# Patient Record
Sex: Female | Born: 1995 | Race: White | Hispanic: No | Marital: Single | Smoking: Current every day smoker
Health system: Southern US, Community
[De-identification: ages and names within clinical notes are randomized; demographics above are authoritative.]

---
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
## 2016-04-29 ENCOUNTER — Emergency Department: Payer: BC Managed Care – PPO

## 2016-04-29 ENCOUNTER — Encounter: Payer: Self-pay | Admitting: *Deleted

## 2016-04-29 ENCOUNTER — Emergency Department
Admission: EM | Admit: 2016-04-29 | Discharge: 2016-04-29 | Disposition: A | Discharge status: Home / Self Care | Payer: BC Managed Care – PPO | Attending: Emergency Medicine | Admitting: Emergency Medicine

## 2016-04-29 DIAGNOSIS — Y92410 Unspecified street and highway as the place of occurrence of the external cause: Secondary | ICD-10-CM | POA: Diagnosis not present

## 2016-04-29 DIAGNOSIS — Y9389 Activity, other specified: Secondary | ICD-10-CM | POA: Diagnosis not present

## 2016-04-29 DIAGNOSIS — S20211A Contusion of right front wall of thorax, initial encounter: Secondary | ICD-10-CM | POA: Insufficient documentation

## 2016-04-29 DIAGNOSIS — Y999 Unspecified external cause status: Secondary | ICD-10-CM | POA: Insufficient documentation

## 2016-04-29 DIAGNOSIS — R0789 Other chest pain: Secondary | ICD-10-CM | POA: Insufficient documentation

## 2016-04-29 DIAGNOSIS — S29001A Unspecified injury of muscle and tendon of front wall of thorax, initial encounter: Secondary | ICD-10-CM | POA: Diagnosis present

## 2016-04-29 LAB — BASIC METABOLIC PANEL
ANION GAP: 8 (ref 5–15)
BUN: 12 mg/dL (ref 6–20)
CHLORIDE: 109 mmol/L (ref 101–111)
CO2: 21 mmol/L — AB (ref 22–32)
Calcium: 9.4 mg/dL (ref 8.9–10.3)
Creatinine, Ser: 0.87 mg/dL (ref 0.44–1.00)
GFR calc non Af Amer: >60 mL/min (ref >60)
Glucose, Bld: 97 mg/dL (ref 65–99)
POTASSIUM: 4.9 mmol/L (ref 3.5–5.1)
SODIUM: 138 mmol/L (ref 135–145)

## 2016-04-29 LAB — CBC
HEMATOCRIT: 43.2 % (ref 35.0–47.0)
HEMOGLOBIN: 14.6 g/dL (ref 12.0–16.0)
MCH: 30.8 pg (ref 26.0–34.0)
MCHC: 33.7 g/dL (ref 32.0–36.0)
MCV: 91.3 fL (ref 80.0–100.0)
PLATELETS: 218 10*3/uL (ref 150–440)
RBC: 4.74 MIL/uL (ref 3.80–5.20)
RDW: 13.5 % (ref 11.5–14.5)
WBC: 8 10*3/uL (ref 3.6–11.0)

## 2016-04-29 LAB — TROPONIN I: Troponin I: <0.03 ng/mL (ref <0.031)

## 2016-04-29 MED ORDER — IBUPROFEN 800 MG PO TABS: 800.0000 mg | ORAL_TABLET | Freq: Three times a day (TID) | ORAL | Status: AC | PRN

## 2016-04-29 MED ORDER — IOPAMIDOL (ISOVUE-300) INJECTION 61%
75.0000 mL | Freq: Once | INTRAVENOUS | Status: AC | PRN
  Administered 2016-04-29: 75 mL via INTRAVENOUS
  Filled 2016-04-29: qty 75

## 2016-04-29 MED ORDER — CYCLOBENZAPRINE HCL 5 MG PO TABS: 5.0000 mg | ORAL_TABLET | Freq: Three times a day (TID) | ORAL | Status: DC | PRN
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
## 2017-07-14 ENCOUNTER — Emergency Department: Payer: Medicaid Other

## 2017-07-14 ENCOUNTER — Emergency Department
Admission: EM | Admit: 2017-07-14 | Discharge: 2017-07-14 | Disposition: A | Discharge status: Home / Self Care | Payer: Medicaid Other | Attending: Emergency Medicine | Admitting: Emergency Medicine

## 2017-07-14 DIAGNOSIS — Y939 Activity, unspecified: Secondary | ICD-10-CM | POA: Diagnosis not present

## 2017-07-14 DIAGNOSIS — Y92831 Amusement park as the place of occurrence of the external cause: Secondary | ICD-10-CM | POA: Diagnosis not present

## 2017-07-14 DIAGNOSIS — Y999 Unspecified external cause status: Secondary | ICD-10-CM | POA: Insufficient documentation

## 2017-07-14 DIAGNOSIS — W16312A Fall into other water striking water surface causing other injury, initial encounter: Secondary | ICD-10-CM | POA: Diagnosis not present

## 2017-07-14 DIAGNOSIS — S83422A Sprain of lateral collateral ligament of left knee, initial encounter: Secondary | ICD-10-CM | POA: Insufficient documentation

## 2017-07-14 DIAGNOSIS — S8992XA Unspecified injury of left lower leg, initial encounter: Secondary | ICD-10-CM | POA: Diagnosis present

## 2017-07-14 MED ORDER — NAPROXEN 500 MG PO TABS: 500.0000 mg | ORAL_TABLET | Freq: Two times a day (BID) | ORAL | Status: DC

## 2017-07-14 NOTE — Discharge Instructions (Signed)
Knee support for 3-5 days as needed.
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
## 2017-07-14 NOTE — ED Triage Notes (Signed)
[facility]ulatory to triage.
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
## 2018-07-09 LAB — HM HIV SCREENING LAB: HM HIV Screening: NEGATIVE

## 2018-11-28 ENCOUNTER — Emergency Department
Admission: EM | Admit: 2018-11-28 | Discharge: 2018-11-28 | Disposition: A | Discharge status: Home / Self Care | Payer: No Typology Code available for payment source | Attending: Student in an Organized Health Care Education/Training Program | Admitting: Student in an Organized Health Care Education/Training Program

## 2018-11-28 ENCOUNTER — Other Ambulatory Visit: Payer: Self-pay

## 2018-11-28 ENCOUNTER — Emergency Department: Payer: No Typology Code available for payment source

## 2018-11-28 ENCOUNTER — Encounter: Payer: Self-pay | Admitting: Emergency Medicine

## 2018-11-28 DIAGNOSIS — Y92410 Unspecified street and highway as the place of occurrence of the external cause: Secondary | ICD-10-CM | POA: Diagnosis not present

## 2018-11-28 DIAGNOSIS — Y998 Other external cause status: Secondary | ICD-10-CM | POA: Insufficient documentation

## 2018-11-28 DIAGNOSIS — M79631 Pain in right forearm: Secondary | ICD-10-CM | POA: Insufficient documentation

## 2018-11-28 DIAGNOSIS — S161XXA Strain of muscle, fascia and tendon at neck level, initial encounter: Secondary | ICD-10-CM | POA: Insufficient documentation

## 2018-11-28 DIAGNOSIS — F17200 Nicotine dependence, unspecified, uncomplicated: Secondary | ICD-10-CM | POA: Insufficient documentation

## 2018-11-28 DIAGNOSIS — S199XXA Unspecified injury of neck, initial encounter: Secondary | ICD-10-CM | POA: Diagnosis present

## 2018-11-28 DIAGNOSIS — Y9389 Activity, other specified: Secondary | ICD-10-CM | POA: Diagnosis not present

## 2018-11-28 DIAGNOSIS — W2210XA Striking against or struck by unspecified automobile airbag, initial encounter: Secondary | ICD-10-CM | POA: Diagnosis not present

## 2018-11-28 MED ORDER — NAPROXEN 500 MG PO TABS: 500.0000 mg | ORAL_TABLET | Freq: Two times a day (BID) | ORAL | Status: DC

## 2018-11-28 MED ORDER — CYCLOBENZAPRINE HCL 5 MG PO TABS: 5.0000 mg | ORAL_TABLET | Freq: Three times a day (TID) | ORAL | 0 refills | Status: DC | PRN
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
## 2018-11-28 NOTE — Discharge Instructions (Signed)
Follow up with the primary care provider of your choice for symptoms that are not improving over the week °Return to the ER for symptoms that change or worsen if unable to schedule an appointment. °
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
## 2019-07-29 ENCOUNTER — Ambulatory Visit (LOCAL_COMMUNITY_HEALTH_CENTER): Payer: Medicaid Other

## 2019-07-29 ENCOUNTER — Other Ambulatory Visit: Payer: Self-pay

## 2019-07-29 VITALS — BP 112/73 | Ht 64.0 in | Wt 118.0 lb

## 2019-07-29 DIAGNOSIS — Z30013 Encounter for initial prescription of injectable contraceptive: Secondary | ICD-10-CM

## 2019-07-29 DIAGNOSIS — Z3009 Encounter for other general counseling and advice on contraception: Secondary | ICD-10-CM

## 2019-07-29 DIAGNOSIS — Z3042 Encounter for surveillance of injectable contraceptive: Secondary | ICD-10-CM

## 2019-07-29 MED ORDER — MEDROXYPROGESTERONE ACETATE 150 MG/ML IM SUSP
150.0000 mg | Freq: Once | INTRAMUSCULAR | Status: AC
  Administered 2019-07-29: 150 mg via INTRAMUSCULAR

## 2019-07-29 NOTE — Progress Notes (Signed)
Last physical at [facility] was 07/09/2018. Last depo at [facility] was 05/11/2019; 11.2 weeks post depo. DMPA 150 mg IM per Dr. Lauretta Chester order for one time depo when physical is due.

## 2019-10-29 ENCOUNTER — Other Ambulatory Visit: Payer: Self-pay

## 2019-10-29 ENCOUNTER — Ambulatory Visit (LOCAL_COMMUNITY_HEALTH_CENTER): Payer: Medicaid Other

## 2019-10-29 VITALS — BP 112/73 | Ht 64.0 in | Wt 131.0 lb

## 2019-10-29 DIAGNOSIS — Z3009 Encounter for other general counseling and advice on contraception: Secondary | ICD-10-CM | POA: Diagnosis not present

## 2019-10-29 DIAGNOSIS — Z30013 Encounter for initial prescription of injectable contraceptive: Secondary | ICD-10-CM | POA: Diagnosis not present

## 2019-10-29 MED ORDER — MEDROXYPROGESTERONE ACETATE 150 MG/ML IM SUSP
150.0000 mg | Freq: Once | INTRAMUSCULAR | Status: AC
Start: 2019-10-29 — End: 2019-10-29
  Administered 2019-10-29: 150 mg via INTRAMUSCULAR

## 2019-10-29 NOTE — Progress Notes (Signed)
Last physical at [facility] was 07/09/2018. Per [employer] Records PAP due 06/2019, CBE due 2022. Last depo at [facility] 07/29/2019; 13.1 weeks. Consulted with provider regarding pt request to continue depo today; DMPA 150 mg IM administered today per Donnal Moat, CNM verbal order, and pt to see provider at next visit when depo is due to have PAP performed.

## 2020-01-27 ENCOUNTER — Other Ambulatory Visit: Payer: Self-pay

## 2020-01-27 ENCOUNTER — Encounter: Payer: Self-pay | Admitting: Physician Assistant

## 2020-01-27 ENCOUNTER — Ambulatory Visit (LOCAL_COMMUNITY_HEALTH_CENTER): Payer: Medicaid Other | Admitting: Physician Assistant

## 2020-01-27 VITALS — BP 130/82 | Ht 64.0 in | Wt 130.0 lb

## 2020-01-27 DIAGNOSIS — Z3009 Encounter for other general counseling and advice on contraception: Secondary | ICD-10-CM

## 2020-01-27 MED ORDER — THERA VITAL M PO TABS: 1.0000 | ORAL_TABLET | Freq: Every day | ORAL | 3 refills | Status: AC

## 2020-01-27 MED ORDER — MEDROXYPROGESTERONE ACETATE 150 MG/ML IM SUSP
150.0000 mg | Freq: Once | INTRAMUSCULAR | Status: AC
  Administered 2020-01-27: 17:00:00 150 mg via INTRAMUSCULAR

## 2020-01-27 NOTE — Progress Notes (Signed)
In for Depo; declines HIV/RPR testing Sharlette Dense, RN MVI given, declined condoms Sharlette Dense, RN

## 2020-01-27 NOTE — Progress Notes (Signed)
Family Planning Visit- Repeat Yearly Visit  Subjective:  Lynn Daniels is a 24 y.o. being seen today for an well woman visit and to discuss family planning options.    She is currently using Depo-Provera injections for pregnancy prevention. Patient reports she does not want a pregnancy in the next year. Patient  does not have a problem list on file. Last sex 06/2019. Last DMPA [redacted]w[redacted]d ago on 10/29/19.  Chief Complaint  Patient presents with  . Contraception    Patient reports she feels well.  Patient denies problems with DMPA, new medical problems since last visit, or new family medical problems.    Does the patient desire a pregnancy in the next year? (OKQ flowsheet)  See flowsheet for other program required questions.   Body mass index is 22.31 kg/m. - Patient is eligible for diabetes screening based on BMI and age >47?  no HA1C ordered? No    Patient reports 1 of partners in last year. Desires STI screening?  No - pt declines, low risk  Does the patient have a current or past history of drug use? No   No components found for: HCV]   Health Maintenance Due  Topic Date Due  . TETANUS/TDAP  07/25/2016  . PAP-Cervical Cytology Screening  07/25/2018  . PAP SMEAR-Modifier  07/25/2018  . INFLUENZA VACCINE  07/31/2019    Review of Systems  All other systems reviewed and are negative.   The following portions of the patient's history were reviewed and updated as appropriate: allergies, current medications, past family history, past medical history, past social history, past surgical history and problem list. Problem list updated.  Objective:   Vitals:   01/27/20 1529  BP: 130/82  Weight: 130 lb (59 kg)  Height: 5\' 4"  (1.626 m)    Physical Exam Constitutional:      Appearance: Normal appearance. She is normal weight.  Pulmonary:     Effort: Pulmonary effort is normal.  Abdominal:     General: Abdomen is flat.     Palpations: Abdomen is soft.     Tenderness: There is  no abdominal tenderness.     Hernia: There is no hernia in the left inguinal area or right inguinal area.  Genitourinary:    Pubic Area: No rash.      Labia:        Right: No rash, tenderness or lesion.        Left: No rash, tenderness or lesion.      Urethra: No urethral pain or urethral swelling.     Vagina: No vaginal discharge, erythema, tenderness, bleeding or lesions.     Cervix: No cervical motion tenderness, discharge, friability, lesion or erythema.     Uterus: Not enlarged and not tender.      Adnexa:        Right: No mass or tenderness.         Left: No mass or tenderness.       Rectum: No external hemorrhoid.  Lymphadenopathy:     Lower Body: No right inguinal adenopathy. No left inguinal adenopathy.  Skin:    General: Skin is warm and dry.     Findings: No rash.  Neurological:     Mental Status: She is alert and oriented to person, place, and time.  Psychiatric:        Mood and Affect: Mood normal.       Assessment and Plan:  Lynn Daniels is a 24 y.o. female presenting to the [facility] for an initial well woman exam/family planning visit  Contraception counseling: Reviewed all forms of birth control options in the tiered based approach. available including abstinence; over the counter/barrier methods; hormonal contraceptive medication including pill, patch, ring, injection,contraceptive implant; hormonal and nonhormonal IUDs; permanent sterilization options including vasectomy and the various tubal sterilization modalities. Risks, benefits, and typical effectiveness rates were reviewed.  Questions were answered.  Written information was also given to the patient to review.  Patient desires DMPA, this was prescribed for patient. She will follow up in  11-13 weeks for surveillance.  She was told to call with any further questions, or with any concerns about this method of contraception.  Emphasized use of condoms 100% of the time for STI  prevention.  Patient was not offered ECP.  1. Family planning services Please give DMPA 150mg  IM today repeat q 11-13 weeks for 1 year. First Pap today. - Multiple Vitamins-Minerals (MULTIVITAMIN) tablet; Take 1 tablet by mouth daily.  Dispense: 30 tablet; Refill: 3 - Pap IG (Image Guided) - medroxyPROGESTERone (DEPO-PROVERA) injection 150 mg   Return in about 12 weeks (around 04/20/2020) for Routine DMPA injection.  No future appointments.  Lora Havens, PA-C

## 2020-01-31 LAB — PAP IG (IMAGE GUIDED): PAP Smear Comment: 0

## 2020-04-08 ENCOUNTER — Ambulatory Visit: Payer: BC Managed Care – PPO

## 2020-04-15 ENCOUNTER — Ambulatory Visit: Payer: BC Managed Care – PPO

## 2020-04-18 ENCOUNTER — Other Ambulatory Visit: Payer: Self-pay

## 2020-04-18 ENCOUNTER — Ambulatory Visit (LOCAL_COMMUNITY_HEALTH_CENTER): Payer: Medicaid Other

## 2020-04-18 VITALS — BP 121/85 | Ht 63.0 in | Wt 122.0 lb

## 2020-04-18 DIAGNOSIS — Z30013 Encounter for initial prescription of injectable contraceptive: Secondary | ICD-10-CM

## 2020-04-18 DIAGNOSIS — Z3042 Encounter for surveillance of injectable contraceptive: Secondary | ICD-10-CM

## 2020-04-18 DIAGNOSIS — Z3009 Encounter for other general counseling and advice on contraception: Secondary | ICD-10-CM | POA: Diagnosis not present

## 2020-04-18 MED ORDER — MEDROXYPROGESTERONE ACETATE 150 MG/ML IM SUSP
150.0000 mg | Freq: Once | INTRAMUSCULAR | Status: AC
  Administered 2020-04-18: 150 mg via INTRAMUSCULAR

## 2020-04-18 NOTE — Progress Notes (Signed)
Here today for Depo. Flowsheet completed. Depo given per 01/27/2020 order. Patient tolerated well. Tawny Hopping, RN

## 2020-04-19 NOTE — Addendum Note (Signed)
Addended by: Tawny Hopping A on: 04/19/2020 12:05 PM   Modules accepted: Level of Service

## 2020-06-16 ENCOUNTER — Encounter: Payer: Self-pay | Admitting: Family Medicine

## 2020-06-16 DIAGNOSIS — R87620 Atypical squamous cells of undetermined significance on cytologic smear of vagina (ASC-US): Secondary | ICD-10-CM | POA: Insufficient documentation

## 2020-07-06 ENCOUNTER — Ambulatory Visit: Payer: Medicaid Other

## 2020-07-12 ENCOUNTER — Other Ambulatory Visit: Payer: Self-pay

## 2020-07-12 ENCOUNTER — Ambulatory Visit (LOCAL_COMMUNITY_HEALTH_CENTER): Payer: Medicaid Other

## 2020-07-12 VITALS — BP 122/83 | Ht 64.0 in | Wt 117.0 lb

## 2020-07-12 DIAGNOSIS — Z30013 Encounter for initial prescription of injectable contraceptive: Secondary | ICD-10-CM

## 2020-07-12 DIAGNOSIS — Z3009 Encounter for other general counseling and advice on contraception: Secondary | ICD-10-CM | POA: Diagnosis not present

## 2020-07-12 MED ORDER — MEDROXYPROGESTERONE ACETATE 150 MG/ML IM SUSP
150.0000 mg | Freq: Once | INTRAMUSCULAR | Status: AC
  Administered 2020-07-12: 150 mg via INTRAMUSCULAR

## 2020-07-12 MED ORDER — MULTI-VITAMIN/MINERALS PO TABS: 1.0000 | ORAL_TABLET | Freq: Every day | ORAL | 0 refills | Status: AC

## 2020-07-12 NOTE — Progress Notes (Signed)
Depo administered without difficulty per 1/282021 written order of A. Streilein PA-C and client tolerated without complaint. Jossie Ng, RN
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
## 2021-01-09 ENCOUNTER — Telehealth: Payer: Self-pay

## 2021-01-09 NOTE — Telephone Encounter (Signed)
Telephone call to patient regarding the need for a repeat PAP and physical due 12/2020. Left message to return my call. Remmie Bembenek, RN  

## 2021-02-07 NOTE — Telephone Encounter (Signed)
Telephone call to patient regarding the need for a repeat PAP and Physical.  Left a message to please return my call at [phone].  PAP letter mailed today. Hart Carwin, RN

## 2021-03-02 ENCOUNTER — Telehealth: Payer: Self-pay

## 2021-03-02 NOTE — Progress Notes (Unsigned)
Patient due for repeat PAP and Physical 12/2020.  No response to messages left on 01/09/2021 and 02/07/2021 nor PAP letter mailed on 02/07/2021.  Telephone call today with no answer.  Close to PAP follow-up until patient initiates contact with [facility]. Hart Carwin, RN

## 2021-03-02 NOTE — Telephone Encounter (Signed)
Telephone call to patient today regarding the need for a repeat PAP and Physical due 12/2020.  No response to messages left previously in January and February nor PAP letter mailed 02/07/2021. Hart Carwin, RN

## 2023-02-28 DIAGNOSIS — Z419 Encounter for procedure for purposes other than remedying health state, unspecified: Secondary | ICD-10-CM | POA: Diagnosis not present

## 2023-03-31 DIAGNOSIS — Z419 Encounter for procedure for purposes other than remedying health state, unspecified: Secondary | ICD-10-CM | POA: Diagnosis not present

## 2023-04-30 DIAGNOSIS — Z419 Encounter for procedure for purposes other than remedying health state, unspecified: Secondary | ICD-10-CM | POA: Diagnosis not present

## 2023-05-31 DIAGNOSIS — Z419 Encounter for procedure for purposes other than remedying health state, unspecified: Secondary | ICD-10-CM | POA: Diagnosis not present

## 2023-06-30 DIAGNOSIS — Z419 Encounter for procedure for purposes other than remedying health state, unspecified: Secondary | ICD-10-CM | POA: Diagnosis not present

## 2023-07-31 DIAGNOSIS — Z419 Encounter for procedure for purposes other than remedying health state, unspecified: Secondary | ICD-10-CM | POA: Diagnosis not present

## 2023-08-31 DIAGNOSIS — Z419 Encounter for procedure for purposes other than remedying health state, unspecified: Secondary | ICD-10-CM | POA: Diagnosis not present

## 2023-09-30 DIAGNOSIS — Z419 Encounter for procedure for purposes other than remedying health state, unspecified: Secondary | ICD-10-CM | POA: Diagnosis not present

## 2023-10-31 DIAGNOSIS — Z419 Encounter for procedure for purposes other than remedying health state, unspecified: Secondary | ICD-10-CM | POA: Diagnosis not present

## 2023-11-30 DIAGNOSIS — Z419 Encounter for procedure for purposes other than remedying health state, unspecified: Secondary | ICD-10-CM | POA: Diagnosis not present

## 2023-12-31 DIAGNOSIS — Z419 Encounter for procedure for purposes other than remedying health state, unspecified: Secondary | ICD-10-CM | POA: Diagnosis not present

## 2024-01-31 DIAGNOSIS — Z419 Encounter for procedure for purposes other than remedying health state, unspecified: Secondary | ICD-10-CM | POA: Diagnosis not present

## 2024-02-28 DIAGNOSIS — Z419 Encounter for procedure for purposes other than remedying health state, unspecified: Secondary | ICD-10-CM | POA: Diagnosis not present

## 2024-03-29 DIAGNOSIS — F3181 Bipolar II disorder: Secondary | ICD-10-CM | POA: Diagnosis not present

## 2024-04-10 DIAGNOSIS — Z419 Encounter for procedure for purposes other than remedying health state, unspecified: Secondary | ICD-10-CM | POA: Diagnosis not present

## 2024-04-15 ENCOUNTER — Encounter: Payer: Self-pay | Admitting: Student

## 2024-04-15 ENCOUNTER — Telehealth: Payer: Self-pay | Admitting: Student

## 2024-04-15 ENCOUNTER — Ambulatory Visit (INDEPENDENT_AMBULATORY_CARE_PROVIDER_SITE_OTHER): Admitting: Student

## 2024-04-15 VITALS — BP 100/64 | HR 97 | Ht 64.0 in | Wt 107.9 lb

## 2024-04-15 DIAGNOSIS — Z720 Tobacco use: Secondary | ICD-10-CM | POA: Diagnosis not present

## 2024-04-15 DIAGNOSIS — Z87898 Personal history of other specified conditions: Secondary | ICD-10-CM

## 2024-04-15 DIAGNOSIS — E039 Hypothyroidism, unspecified: Secondary | ICD-10-CM | POA: Insufficient documentation

## 2024-04-15 DIAGNOSIS — B370 Candidal stomatitis: Secondary | ICD-10-CM | POA: Insufficient documentation

## 2024-04-15 DIAGNOSIS — N63 Unspecified lump in unspecified breast: Secondary | ICD-10-CM | POA: Diagnosis not present

## 2024-04-15 DIAGNOSIS — N200 Calculus of kidney: Secondary | ICD-10-CM | POA: Insufficient documentation

## 2024-04-15 DIAGNOSIS — R87620 Atypical squamous cells of undetermined significance on cytologic smear of vagina (ASC-US): Secondary | ICD-10-CM | POA: Diagnosis not present

## 2024-04-15 DIAGNOSIS — J029 Acute pharyngitis, unspecified: Secondary | ICD-10-CM | POA: Diagnosis not present

## 2024-04-15 NOTE — Assessment & Plan Note (Signed)
>>  ASSESSMENT AND PLAN FOR SORE THROAT WRITTEN ON 04/15/2024 10:18 AM BY Cari Char, Verne Lanuza, MD  Sore throat and muffled voice. Her dentist referred her to ENT she has number to schedule and plan to set up appointment. Vitals are stable and she appears non toxic. Exudates and cervical lymphadenopathy on exam. Offered evaluation including throat swab, culture, and antibiotics. Discussed it may take time to have this scheduled and we could treat for infection in the meantime. Patient understands but declined evaluation stating she would have this evaluated by ENT. Return precautions discussed.

## 2024-04-15 NOTE — Progress Notes (Signed)
 New Patient Office Visit  Subjective    Patient ID: Lynn Daniels, female    DOB: 15-Apr-1996  Age: 28 y.o. MRN: [id number]  CC:  Chief Complaint  Patient presents with   Breast Problem    Breast lump in both breast and painful and tender to touch. She noticed one lump in her left breast 2 months ago and noticed the lump in her right breast 2 weeks ago.     HPI Lynn Daniels is a 28 year old person presents to establish care.  Breast mass She presents for evaluation of bilateral breast mass. First noticed left sided mass 2 months when moving her arm. 2  weeks ago also noticed right sided mass while showering. Mild irritation arms brush against her sides. Does not think masses has changes in size. She denies rash, nipple discharge,  fever chills or weight loss. Reports great aunt on mother's side had breast cancer.    Outpatient Encounter Medications as of 04/15/2024  Medication Sig   acetaminophen (TYLENOL) 325 MG tablet Take 650 mg by mouth every 6 (six) hours as needed. PRN (Patient not taking: Reported on 04/15/2024)   ibuprofen (ADVIL,MOTRIN) 800 MG tablet Take 1 tablet (800 mg total) by mouth every 8 (eight) hours as needed. (Patient not taking: Reported on 04/15/2024)   Multiple Vitamins-Minerals (MULTIVITAMIN WITH MINERALS) tablet Take 1 tablet by mouth daily. (Patient not taking: Reported on 04/15/2024)   [DISCONTINUED] cyclobenzaprine (FLEXERIL) 5 MG tablet Take 1 tablet (5 mg total) by mouth every 8 (eight) hours as needed for muscle spasms. (Patient not taking: Reported on 07/29/2019)   [DISCONTINUED] naproxen (NAPROSYN) 500 MG tablet Take 1 tablet (500 mg total) by mouth 2 (two) times daily with a meal. (Patient not taking: Reported on 07/29/2019)   No facility-administered encounter medications on file as of 04/15/2024.    History reviewed. No pertinent past medical history.  History reviewed. No pertinent surgical history.  Family History  Problem Relation Age of Onset    Hypertension Father     Social History   Socioeconomic History   Marital status: Single    Spouse name: Not on file   Number of children: Not on file   Years of education: Not on file   Highest education level: Not on file  Occupational History   Not on file  Tobacco Use   Smoking status: Every Day    Average packs/day: 0.3 packs/day for 4.0 years (1.0 ttl pk-yrs)    Types: Cigarettes    Start date: 2021   Smokeless tobacco: Never  Substance and Sexual Activity   Alcohol use: Not Currently   Drug use: Not Currently    Types: Cocaine, Oxycodone    Comment: last use about a year   Sexual activity: Yes    Partners: Male    Comment: single long term partner  Other Topics Concern   Not on file  Social History Narrative   Works at [employer] investment banker Strain: Not on file  Food Insecurity: Not on file  Transportation Needs: Not on file  Physical Activity: Not on file  Stress: Not on file  Social Connections: Not on file  Intimate Partner Violence: Not on file    ROS Refer to HPI    Objective   BP 100/64   Pulse 97   Ht 5\' 4"  (1.626 m)   Wt 107 lb 14.4 oz (48.9 kg)   SpO2 98%   BMI  18.52 kg/m   Physical Exam Constitutional:      Appearance: Normal appearance.  HENT:     Mouth/Throat:     Mouth: Mucous membranes are moist.     Pharynx: Oropharyngeal exudate and posterior oropharyngeal erythema present. No uvula swelling.     Tonsils: Tonsillar exudate present. No tonsillar abscesses.      Comments: Dental cares without gingival swelling or edema Neck:     Thyroid: No thyroid mass, thyromegaly or thyroid tenderness.  Cardiovascular:     Rate and Rhythm: Normal rate and regular rhythm.  Pulmonary:     Effort: Pulmonary effort is normal.     Breath sounds: No rhonchi or rales.  Chest:  Breasts:    Right: Mass present. No swelling, inverted nipple, nipple discharge (2 cm mobile mass of the upper outer qudrant) or skin  change.     Left: Mass (1.5 cm mobile mass of the lower outer quadrant) present. No swelling, inverted nipple, nipple discharge or skin change.  Abdominal:     General: Abdomen is flat. Bowel sounds are normal. There is no distension.     Palpations: Abdomen is soft.     Tenderness: There is no abdominal tenderness.  Musculoskeletal:        General: Normal range of motion.     Right lower leg: No edema.     Left lower leg: No edema.  Lymphadenopathy:     Cervical: Cervical adenopathy present.     Right cervical: Posterior cervical adenopathy (0.5 cm node behind ear) present.     Left cervical: Posterior cervical adenopathy (multiple subcentimeter mobile) present.     Upper Body:     Right upper body: No supraclavicular, axillary or pectoral adenopathy.     Left upper body: No supraclavicular, axillary or pectoral adenopathy.  Skin:    General: Skin is warm and dry.     Capillary Refill: Capillary refill takes less than 2 seconds.  Neurological:     General: No focal deficit present.     Mental Status: She is alert and oriented to person, place, and time.  Psychiatric:        Mood and Affect: Mood normal.        Behavior: Behavior normal.        04/15/2024    9:15 AM  Depression screen PHQ 2/9  Decreased Interest 0  Down, Depressed, Hopeless 1  PHQ - 2 Score 1  Altered sleeping 1  Tired, decreased energy 0  Change in appetite 0  Feeling bad or failure about yourself  0  Trouble concentrating 2  Moving slowly or fidgety/restless 0  Suicidal thoughts 0  PHQ-9 Score 4  Difficult doing work/chores Not difficult at all      04/15/2024    9:15 AM  GAD 7 : Generalized Anxiety Score  Nervous, Anxious, on Edge 0  Control/stop worrying 0  Worry too much - different things 0  Trouble relaxing 0  Restless 0  Easily annoyed or irritable 0  Afraid - awful might happen 0  Total GAD 7 Score 0  Anxiety Difficulty Not difficult at all    Last CBC Lab Results  Component Value  Date   WBC 8.0 04/29/2016   HGB 14.6 04/29/2016   HCT 43.2 04/29/2016   MCV 91.3 04/29/2016   MCH 30.8 04/29/2016   RDW 13.5 04/29/2016   PLT 218 04/29/2016   Last metabolic panel Lab Results  Component Value Date   GLUCOSE 97 04/29/2016  NA 138 04/29/2016   K 4.9 04/29/2016   CL 109 04/29/2016   CO2 21 (L) 04/29/2016   BUN 12 04/29/2016   CREATININE 0.87 04/29/2016   GFRNONAA >60 04/29/2016   CALCIUM 9.4 04/29/2016   ANIONGAP 8 04/29/2016    Assessment & Plan:  Breast mass in female Assessment & Plan: Bilateral breast mass on exam. Family history of breast cancer in maternal grand aunt. Bilateral breast US .   Orders: -     US  BREAST COMPLETE UNI LEFT INC AXILLA; Future -     US  BREAST COMPLETE UNI RIGHT INC AXILLA; Future  Atypical squamous cell changes of undetermined significance (ASCUS) on vaginal cytology Assessment & Plan: She is due for PAP, will return for physical to have this completed    Sore throat Assessment & Plan: Sore throat and muffled voice. Her dentist referred her to ENT she has number to schedule and plan to set up appointment. Vitals are stable and she appears non toxic. Exudates and cervical lymphadenopathy on exam. Offered evaluation including throat swab, culture, and antibiotics. Discussed it may take time to have this scheduled and we could treat for infection in the meantime. Patient understands but declined evaluation stating she would have this evaluated by ENT. Return precautions discussed.     Tobacco use Assessment & Plan: Smokes 2-4 cigarettes daily to every other day. She is  contemplating quitting but has not made any plans, she declined medical therapy at this time.    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 Stopped after her sister passed due to drug use.      Return in about 3 months (around 07/15/2024) for physical.   Barnetta Liberty, MD

## 2024-04-15 NOTE — Telephone Encounter (Signed)
 Copied from CRM [id number]. Topic: General - Running Late >> Apr 15, 2024  8:45 AM Rosamond Comes wrote: Patient/patient representative is calling because they are running late for an appointment.  Patient calling in to say 7 minutes late called [facility] provider stated to come on in.

## 2024-04-15 NOTE — Assessment & Plan Note (Signed)
 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 Stopped after her sister passed due to drug use.

## 2024-04-15 NOTE — Assessment & Plan Note (Signed)
 Smokes 2-4 cigarettes daily to every other day. She is  contemplating quitting but has not made any plans, she declined medical therapy at this time.

## 2024-04-15 NOTE — Assessment & Plan Note (Signed)
 Bilateral breast mass on exam. Family history of breast cancer in maternal grand aunt. Bilateral breast US .

## 2024-04-15 NOTE — Assessment & Plan Note (Signed)
 She is due for PAP, will return for physical to have this completed

## 2024-04-15 NOTE — Assessment & Plan Note (Addendum)
 Sore throat and muffled voice. Her dentist referred her to ENT she has number to schedule and plan to set up appointment. Vitals are stable and she appears non toxic. Exudates and cervical lymphadenopathy on exam. Offered evaluation including throat swab, culture, and antibiotics. Discussed it may take time to have this scheduled and we could treat for infection in the meantime. Patient understands but declined evaluation stating she would have this evaluated by ENT. Return precautions discussed.

## 2024-04-23 ENCOUNTER — Ambulatory Visit
Admission: RE | Admit: 2024-04-23 | Discharge: 2024-04-23 | Disposition: A | Discharge status: Home / Self Care | Source: Ambulatory Visit | Attending: Student | Admitting: Student

## 2024-04-23 DIAGNOSIS — N6311 Unspecified lump in the right breast, upper outer quadrant: Secondary | ICD-10-CM | POA: Diagnosis not present

## 2024-04-23 DIAGNOSIS — N63 Unspecified lump in unspecified breast: Secondary | ICD-10-CM | POA: Diagnosis not present

## 2024-04-23 DIAGNOSIS — N6320 Unspecified lump in the left breast, unspecified quadrant: Secondary | ICD-10-CM | POA: Diagnosis not present

## 2024-04-28 ENCOUNTER — Encounter: Admitting: Student

## 2024-04-29 ENCOUNTER — Ambulatory Visit (INDEPENDENT_AMBULATORY_CARE_PROVIDER_SITE_OTHER): Admitting: Student

## 2024-04-29 ENCOUNTER — Other Ambulatory Visit (HOSPITAL_COMMUNITY)
Admission: RE | Admit: 2024-04-29 | Discharge: 2024-04-29 | Disposition: A | Discharge status: Home / Self Care | Source: Ambulatory Visit | Attending: Student | Admitting: Student

## 2024-04-29 ENCOUNTER — Encounter: Payer: Self-pay | Admitting: Student

## 2024-04-29 VITALS — BP 118/78 | HR 99 | Ht 64.0 in | Wt 107.0 lb

## 2024-04-29 DIAGNOSIS — Z720 Tobacco use: Secondary | ICD-10-CM

## 2024-04-29 DIAGNOSIS — Z0000 Encounter for general adult medical examination without abnormal findings: Secondary | ICD-10-CM | POA: Diagnosis not present

## 2024-04-29 DIAGNOSIS — B370 Candidal stomatitis: Secondary | ICD-10-CM

## 2024-04-29 DIAGNOSIS — Z113 Encounter for screening for infections with a predominantly sexual mode of transmission: Secondary | ICD-10-CM | POA: Insufficient documentation

## 2024-04-29 DIAGNOSIS — R87620 Atypical squamous cells of undetermined significance on cytologic smear of vagina (ASC-US): Secondary | ICD-10-CM | POA: Diagnosis not present

## 2024-04-29 DIAGNOSIS — Z124 Encounter for screening for malignant neoplasm of cervix: Secondary | ICD-10-CM | POA: Insufficient documentation

## 2024-04-29 DIAGNOSIS — Z23 Encounter for immunization: Secondary | ICD-10-CM

## 2024-04-29 DIAGNOSIS — Z1159 Encounter for screening for other viral diseases: Secondary | ICD-10-CM | POA: Diagnosis not present

## 2024-04-29 MED ORDER — FLUCONAZOLE 100 MG PO TABS: ORAL_TABLET | ORAL | 0 refills | Status: DC

## 2024-04-29 MED ORDER — FLUCONAZOLE 100 MG PO TABS: 200.0000 mg | ORAL_TABLET | Freq: Every day | ORAL | 0 refills | Status: AC

## 2024-04-29 NOTE — Progress Notes (Signed)
 Complete physical exam  Patient: Lynn Daniels   DOB: 04/10/1996   27 y.o. Female  MRN: [id number]  Subjective:    Chief Complaint  Patient presents with   Annual Exam    Lynn Daniels is a 28 y.o. female who presents today for a complete physical exam. She reports consuming a general diet. She reports sleeping well. She does have additional problems to discuss today. History is limited due to patient losing her voice.   Sore throat Has been having sore throat off and on, but lost voice about 1 week ago. Feels she has some swollen lymph node in her neck. Denies fevers, chills, pain with swallowing. Does have multiple dental carries. Dentist referred her to ENT due to small hole above uvula.   Patient Active Problem List   Diagnosis Date Noted   Annual physical exam 04/29/2024   Tobacco use 04/15/2024   History of substance use 04/15/2024   Oropharyngeal candidiasis 04/15/2024   Atypical squamous cell changes of undetermined significance (ASCUS) on vaginal cytology 06/16/2020    Patient Care Team: Barnetta Liberty, MD as PCP - General (Internal Medicine)   Outpatient Medications Prior to Visit  Medication Sig   acetaminophen (TYLENOL) 325 MG tablet Take 650 mg by mouth every 6 (six) hours as needed. PRN   ibuprofen  (ADVIL ,MOTRIN ) 800 MG tablet Take 1 tablet (800 mg total) by mouth every 8 (eight) hours as needed.   Multiple Vitamins-Minerals (MULTIVITAMIN WITH MINERALS) tablet Take 1 tablet by mouth daily.   No facility-administered medications prior to visit.    ROS Refer to HPI     Objective:    BP 118/78   Pulse 99   Ht 5\' 4"  (1.626 m)   Wt 107 lb (48.5 kg)   LMP 04/08/2024 (Approximate)   SpO2 97%   BMI 18.37 kg/m  BP Readings from Last 3 Encounters:  04/29/24 118/78  04/15/24 100/64  07/12/20 122/83    Physical Exam Constitutional:      Appearance: Normal appearance.  HENT:     Mouth/Throat:     Dentition: Dental caries present.     Tongue: No  lesions.     Pharynx: Posterior oropharyngeal erythema present. No oropharyngeal exudate.     Tonsils: No tonsillar exudate.     Comments: Pseudomembranes on the tongue and pharynx  Cardiovascular:     Rate and Rhythm: Normal rate and regular rhythm.  Pulmonary:     Effort: Pulmonary effort is normal.     Breath sounds: No rhonchi or rales.  Abdominal:     General: Abdomen is flat. Bowel sounds are normal. There is no distension.     Palpations: Abdomen is soft.     Tenderness: There is no abdominal tenderness.  Musculoskeletal:        General: Normal range of motion.     Right lower leg: No edema.     Left lower leg: No edema.  Lymphadenopathy:     Cervical: Cervical adenopathy present.  Skin:    General: Skin is warm and dry.     Capillary Refill: Capillary refill takes less than 2 seconds.  Neurological:     General: No focal deficit present.     Mental Status: She is alert and oriented to person, place, and time.  Psychiatric:        Mood and Affect: Mood normal.        Behavior: Behavior normal.         04/29/2024    9:38  AM 04/15/2024    9:15 AM  Depression screen PHQ 2/9  Decreased Interest 1 0  Down, Depressed, Hopeless 1 1  PHQ - 2 Score 2 1  Altered sleeping 2 1  Tired, decreased energy 2 0  Change in appetite 2 0  Feeling bad or failure about yourself  1 0  Trouble concentrating 1 2  Moving slowly or fidgety/restless 2 0  Suicidal thoughts 0 0  PHQ-9 Score 12 4  Difficult doing work/chores Not difficult at all Not difficult at all      04/29/2024    9:39 AM 04/15/2024    9:15 AM  GAD 7 : Generalized Anxiety Score  Nervous, Anxious, on Edge 1 0  Control/stop worrying 2 0  Worry too much - different things 2 0  Trouble relaxing 2 0  Restless 1 0  Easily annoyed or irritable 2 0  Afraid - awful might happen 1 0  Total GAD 7 Score 11 0  Anxiety Difficulty Somewhat difficult Not difficult at all    No results found for any visits on 04/29/24. Last  CBC Lab Results  Component Value Date   WBC 8.0 04/29/2016   HGB 14.6 04/29/2016   HCT 43.2 04/29/2016   MCV 91.3 04/29/2016   MCH 30.8 04/29/2016   RDW 13.5 04/29/2016   PLT 218 04/29/2016   Last metabolic panel Lab Results  Component Value Date   GLUCOSE 97 04/29/2016   NA 138 04/29/2016   K 4.9 04/29/2016   CL 109 04/29/2016   CO2 21 (L) 04/29/2016   BUN 12 04/29/2016   CREATININE 0.87 04/29/2016   GFRNONAA >60 04/29/2016   CALCIUM 9.4 04/29/2016   ANIONGAP 8 04/29/2016        Assessment & Plan:    Routine Health Maintenance and Physical Exam  Health Maintenance  Topic Date Due   Hepatitis C Screening  Never done   Pap Smear  01/26/2021   COVID-19 Vaccine (3 - 2024-25 season) 08/31/2023   Flu Shot  07/30/2024   DTaP/Tdap/Td vaccine (3 - Td or Tdap) 04/29/2034   Pneumococcal Vaccination  Completed   HPV Vaccine  Completed   HIV Screening  Completed   Meningitis B Vaccine  Aged Out    Discussed health benefits of physical activity, and encouraged her to engage in regular exercise appropriate for her age and condition.  Atypical squamous cell changes of undetermined significance (ASCUS) on vaginal cytology Assessment & Plan: ASCUS on 01/27/2020 with plan to repeat pap in 1 year however she was lost to follow up. Pap performed today  Orders: -     Cytology - PAP  Screening for viral disease -     Hepatitis C antibody -     HIV Antibody (routine testing w rflx)  Annual physical exam Assessment & Plan: Pap smear performed, STI screening today Hepatitis C ordered Pneumonia and tdap administered  Discussed tobacco cessation only smoking a few cigarettes daily/every other day  Orders: -     CBC -     Comprehensive metabolic panel with GFR -     Lipid panel  Screening examination for STI -     Hepatitis C antibody -     Cytology - PAP -     RPR  Encounter for immunization -     Tdap vaccine greater than or equal to 7yo IM -     Pneumococcal  conjugate vaccine 20-valent  Oropharyngeal candidiasis Assessment & Plan: Hoarseness and pseudomembranes on exam.  Strep test is negative. Hoarseness is concerning for esophageal candidiasis. She has not made follow up with ENT, has a referral from dentist. Start fluconazole  200 mg for 14 days. HIV ordered     Tobacco use Assessment & Plan: Encouraged her to stop smoking   Other orders -     Fluconazole ; Take 2 tablets (200 mg total) by mouth daily for 14 days.  Dispense: 28 tablet; Refill: 0    Return in about 4 weeks (around 05/27/2024) for sore throat.     Barnetta Liberty, MD

## 2024-04-29 NOTE — Assessment & Plan Note (Addendum)
 Pap smear performed, STI screening today Hepatitis C ordered Pneumonia and tdap administered  Discussed tobacco cessation only smoking a few cigarettes daily/every other day

## 2024-04-29 NOTE — Assessment & Plan Note (Addendum)
 ASCUS on 01/27/2020 with plan to repeat pap in 1 year however she was lost to follow up. Pap performed today

## 2024-04-29 NOTE — Assessment & Plan Note (Signed)
Encouraged her to stop smoking.  

## 2024-04-29 NOTE — Assessment & Plan Note (Signed)
 Hoarseness and pseudomembranes on exam. Strep test is negative. Hoarseness is concerning for esophageal candidiasis. She has not made follow up with ENT, has a referral from dentist. Start fluconazole  200 mg for 14 days. HIV ordered

## 2024-04-29 NOTE — Addendum Note (Signed)
 Addended by: Barnetta Liberty on: 04/29/2024 10:57 AM   Modules accepted: Orders

## 2024-04-30 LAB — COMPREHENSIVE METABOLIC PANEL WITH GFR
ALT: 7 IU/L (ref 0–32)
AST: 11 IU/L (ref 0–40)
Albumin: 4.3 g/dL (ref 4.0–5.0)
Alkaline Phosphatase: 98 IU/L (ref 44–121)
BUN/Creatinine Ratio: 10 (ref 9–23)
BUN: 7 mg/dL (ref 6–20)
Bilirubin Total: <0.2 mg/dL (ref 0.0–1.2)
CO2: 23 mmol/L (ref 20–29)
Calcium: 9.3 mg/dL (ref 8.7–10.2)
Chloride: 100 mmol/L (ref 96–106)
Creatinine, Ser: 0.68 mg/dL (ref 0.57–1.00)
Globulin, Total: 2.7 g/dL (ref 1.5–4.5)
Glucose: 101 mg/dL — ABNORMAL HIGH (ref 70–99)
Potassium: 4 mmol/L (ref 3.5–5.2)
Sodium: 137 mmol/L (ref 134–144)
Total Protein: 7 g/dL (ref 6.0–8.5)
eGFR: 123 mL/min/{1.73_m2} (ref >59)

## 2024-04-30 LAB — CBC
Hematocrit: 38 % (ref 34.0–46.6)
Hemoglobin: 12.7 g/dL (ref 11.1–15.9)
MCH: 29.9 pg (ref 26.6–33.0)
MCHC: 33.4 g/dL (ref 31.5–35.7)
MCV: 89 fL (ref 79–97)
Platelets: 310 10*3/uL (ref 150–450)
RBC: 4.25 x10E6/uL (ref 3.77–5.28)
RDW: 11.6 % — ABNORMAL LOW (ref 11.7–15.4)
WBC: 8.2 10*3/uL (ref 3.4–10.8)

## 2024-04-30 LAB — RPR: RPR Ser Ql: NONREACTIVE

## 2024-04-30 LAB — LIPID PANEL
Chol/HDL Ratio: 3.3 ratio (ref 0.0–4.4)
Cholesterol, Total: 123 mg/dL (ref 100–199)
HDL: 37 mg/dL — ABNORMAL LOW (ref >39)
LDL Chol Calc (NIH): 73 mg/dL (ref 0–99)
Triglycerides: 60 mg/dL (ref 0–149)
VLDL Cholesterol Cal: 13 mg/dL (ref 5–40)

## 2024-04-30 LAB — HIV ANTIBODY (ROUTINE TESTING W REFLEX): HIV Screen 4th Generation wRfx: NONREACTIVE

## 2024-04-30 LAB — HEPATITIS C ANTIBODY: Hep C Virus Ab: NONREACTIVE

## 2024-05-03 ENCOUNTER — Encounter: Payer: Self-pay | Admitting: Student

## 2024-05-03 LAB — POCT RAPID STREP A (OFFICE): Rapid Strep A Screen: NEGATIVE

## 2024-05-03 NOTE — Addendum Note (Signed)
 Addended by: Rama Burkitt on: 05/03/2024 08:59 AM   Modules accepted: Orders

## 2024-05-04 NOTE — Telephone Encounter (Signed)
 Please review

## 2024-05-05 ENCOUNTER — Other Ambulatory Visit: Payer: Self-pay

## 2024-05-05 ENCOUNTER — Emergency Department
Admission: EM | Admit: 2024-05-05 | Discharge: 2024-05-05 | Disposition: A | Discharge status: Home / Self Care | Attending: Emergency Medicine | Admitting: Emergency Medicine

## 2024-05-05 ENCOUNTER — Emergency Department

## 2024-05-05 DIAGNOSIS — J019 Acute sinusitis, unspecified: Secondary | ICD-10-CM | POA: Insufficient documentation

## 2024-05-05 DIAGNOSIS — D72829 Elevated white blood cell count, unspecified: Secondary | ICD-10-CM | POA: Diagnosis not present

## 2024-05-05 DIAGNOSIS — J029 Acute pharyngitis, unspecified: Secondary | ICD-10-CM | POA: Diagnosis not present

## 2024-05-05 DIAGNOSIS — R221 Localized swelling, mass and lump, neck: Secondary | ICD-10-CM | POA: Diagnosis not present

## 2024-05-05 DIAGNOSIS — E876 Hypokalemia: Secondary | ICD-10-CM | POA: Insufficient documentation

## 2024-05-05 DIAGNOSIS — R490 Dysphonia: Secondary | ICD-10-CM | POA: Diagnosis not present

## 2024-05-05 DIAGNOSIS — R079 Chest pain, unspecified: Secondary | ICD-10-CM | POA: Diagnosis not present

## 2024-05-05 DIAGNOSIS — J320 Chronic maxillary sinusitis: Secondary | ICD-10-CM | POA: Diagnosis not present

## 2024-05-05 DIAGNOSIS — R0789 Other chest pain: Secondary | ICD-10-CM | POA: Diagnosis not present

## 2024-05-05 LAB — COMPREHENSIVE METABOLIC PANEL WITH GFR
ALT: 9 U/L (ref 0–44)
AST: 18 U/L (ref 15–41)
Albumin: 3.8 g/dL (ref 3.5–5.0)
Alkaline Phosphatase: 80 U/L (ref 38–126)
Anion gap: 10 (ref 5–15)
BUN: 8 mg/dL (ref 6–20)
CO2: 23 mmol/L (ref 22–32)
Calcium: 9 mg/dL (ref 8.9–10.3)
Chloride: 98 mmol/L (ref 98–111)
Creatinine, Ser: 0.73 mg/dL (ref 0.44–1.00)
GFR, Estimated: >60 mL/min (ref >60)
Glucose, Bld: 139 mg/dL — ABNORMAL HIGH (ref 70–99)
Potassium: 3 mmol/L — ABNORMAL LOW (ref 3.5–5.1)
Sodium: 131 mmol/L — ABNORMAL LOW (ref 135–145)
Total Bilirubin: 0.5 mg/dL (ref 0.0–1.2)
Total Protein: 7.9 g/dL (ref 6.5–8.1)

## 2024-05-05 LAB — CBC WITH DIFFERENTIAL/PLATELET
Abs Immature Granulocytes: 0.07 10*3/uL (ref 0.00–0.07)
Basophils Absolute: 0.1 10*3/uL (ref 0.0–0.1)
Basophils Relative: 0 %
Eosinophils Absolute: 0.1 10*3/uL (ref 0.0–0.5)
Eosinophils Relative: 1 %
HCT: 41.8 % (ref 36.0–46.0)
Hemoglobin: 13.5 g/dL (ref 12.0–15.0)
Immature Granulocytes: 0 %
Lymphocytes Relative: 10 %
Lymphs Abs: 1.6 10*3/uL (ref 0.7–4.0)
MCH: 29.9 pg (ref 26.0–34.0)
MCHC: 32.3 g/dL (ref 30.0–36.0)
MCV: 92.7 fL (ref 80.0–100.0)
Monocytes Absolute: 0.6 10*3/uL (ref 0.1–1.0)
Monocytes Relative: 4 %
Neutro Abs: 13.2 10*3/uL — ABNORMAL HIGH (ref 1.7–7.7)
Neutrophils Relative %: 85 %
Platelets: 312 10*3/uL (ref 150–400)
RBC: 4.51 MIL/uL (ref 3.87–5.11)
RDW: 12.5 % (ref 11.5–15.5)
WBC: 15.6 10*3/uL — ABNORMAL HIGH (ref 4.0–10.5)
nRBC: 0 % (ref 0.0–0.2)

## 2024-05-05 LAB — CYTOLOGY - PAP
Chlamydia: NEGATIVE
Comment: NEGATIVE
Comment: NEGATIVE
Comment: NEGATIVE
Comment: NORMAL
Diagnosis: NEGATIVE
HSV1: NEGATIVE
HSV2: NEGATIVE
Neisseria Gonorrhea: NEGATIVE
Trichomonas: NEGATIVE

## 2024-05-05 LAB — TROPONIN I (HIGH SENSITIVITY): Troponin I (High Sensitivity): 2 ng/L (ref <18)

## 2024-05-05 LAB — LIPASE, BLOOD: Lipase: 23 U/L (ref 11–51)

## 2024-05-05 MED ORDER — PREDNISONE 10 MG PO TABS: ORAL_TABLET | ORAL | 0 refills | Status: AC

## 2024-05-05 MED ORDER — AMOXICILLIN-POT CLAVULANATE 875-125 MG PO TABS: 1.0000 | ORAL_TABLET | Freq: Two times a day (BID) | ORAL | 0 refills | Status: AC

## 2024-05-05 MED ORDER — IOHEXOL 300 MG/ML  SOLN
75.0000 mL | Freq: Once | INTRAMUSCULAR | Status: AC | PRN
  Administered 2024-05-05: 75 mL via INTRAVENOUS

## 2024-05-05 MED ORDER — DEXAMETHASONE 10 MG/ML FOR PEDIATRIC ORAL USE
10.0000 mg | Freq: Once | INTRAMUSCULAR | Status: AC
  Administered 2024-05-05: 10 mg via ORAL
  Filled 2024-05-05 (×2): qty 1

## 2024-05-05 MED ORDER — AMOXICILLIN-POT CLAVULANATE 875-125 MG PO TABS
1.0000 | ORAL_TABLET | Freq: Once | ORAL | Status: AC
  Administered 2024-05-05: 1 via ORAL
  Filled 2024-05-05: qty 1

## 2024-05-05 MED ORDER — POTASSIUM CHLORIDE CRYS ER 20 MEQ PO TBCR
40.0000 meq | EXTENDED_RELEASE_TABLET | Freq: Once | ORAL | Status: AC
  Administered 2024-05-05: 40 meq via ORAL
  Filled 2024-05-05: qty 2

## 2024-05-05 NOTE — ED Triage Notes (Signed)
 POV with CC of hoarseness x1.5 weeks. Pt reports being tested for COVID and strep - negative. Reports neck lumps x2 years. A&Ox4 and ambulatory with steady gait.

## 2024-05-05 NOTE — Telephone Encounter (Signed)
 Please review.  KP

## 2024-05-05 NOTE — Telephone Encounter (Signed)
 Please review and advise.   JM

## 2024-05-05 NOTE — ED Provider Notes (Signed)
 [facility] Provider Note    Event Date/Time   First MD Initiated Contact with Patient 05/05/24 [phone]     (approximate)   History   Chief Complaint Hoarse   HPI  Lynn Daniels is a 28 y.o. female with no significant past medical history who presents to the ED complaining of hoarse voice.  Patient reports that her voice has been hoarse consistently for the past week and a half with some mild sore throat and pain when swallowing.  She denies any fevers or cough, does state that she has noticed swollen nodules down both sides of her neck for the past 2 years.  She was also told previously by her dentist that she has a "hole" near the base of her uvula.  She was referred to ENT at that time last year, but did not follow-up due to lack of insurance.  She also endorses some pain around her epigastrium as well as her lower chest over the past 2 days.  She denies any shortness of breath, nausea, vomiting, diarrhea, or dysuria.      Physical Exam   Triage Vital Signs: ED Triage Vitals  Encounter Vitals Group     BP 05/05/24 0509 (!) 140/108     Systolic BP Percentile --      Diastolic BP Percentile --      Pulse Rate 05/05/24 0509 (!) 120     Resp 05/05/24 0509 20     Temp 05/05/24 0509 98.1 F (36.7 C)     Temp Source 05/05/24 0509 Oral     SpO2 05/05/24 0509 100 %     Weight --      Height --      Head Circumference --      Peak Flow --      Pain Score 05/05/24 0515 4     Pain Loc --      Pain Education --      Exclude from Growth Chart --     Most recent vital signs: Vitals:   05/05/24 0509  BP: (!) 140/108  Pulse: (!) 120  Resp: 20  Temp: 98.1 F (36.7 C)  SpO2: 100%    Constitutional: Alert and oriented. Eyes: Conjunctivae are normal. Head: Atraumatic. Nose: No congestion/rhinnorhea. Mouth/Throat: Mucous membranes are moist.  Posterior oropharynx with necrotic appearing tissue and no edema.  Small opening noted near the base of the  uvula in the soft palate without drainage. Neck: Mild lymphadenopathy with associated tenderness bilaterally, no overlying erythema or warmth noted. Cardiovascular: Normal rate, regular rhythm. Grossly normal heart sounds.  2+ radial pulses bilaterally. Respiratory: Normal respiratory effort.  No retractions. Lungs CTAB. Gastrointestinal: Soft and nontender. No distention. Musculoskeletal: No lower extremity tenderness nor edema.  Neurologic:  Normal speech and language. No gross focal neurologic deficits are appreciated.    ED Results / Procedures / Treatments   Labs (all labs ordered are listed, but only abnormal results are displayed) Labs Reviewed  CBC WITH DIFFERENTIAL/PLATELET - Abnormal; Notable for the following components:      Result Value   WBC 15.6 (*)    Neutro Abs 13.2 (*)    All other components within normal limits  COMPREHENSIVE METABOLIC PANEL WITH GFR - Abnormal; Notable for the following components:   Sodium 131 (*)    Potassium 3.0 (*)    Glucose, Bld 139 (*)    All other components within normal limits  LIPASE, BLOOD  TROPONIN I (HIGH SENSITIVITY)  EKG  ED ECG REPORT I, Twilla Galea, the attending physician, personally viewed and interpreted this ECG.   Date: 05/05/2024  EKG Time: 5:43  Rate: 97  Rhythm: normal sinus rhythm  Axis: Normal  Intervals:none  ST&T Change: None  RADIOLOGY Chest x-ray reviewed and interpreted by me with no infiltrate, edema, or effusion.  PROCEDURES:  Critical Care performed: No  Procedures   MEDICATIONS ORDERED IN ED: Medications  amoxicillin-clavulanate (AUGMENTIN) 875-125 MG per tablet 1 tablet (has no administration in time range)  potassium chloride SA (KLOR-CON M) CR tablet 40 mEq (has no administration in time range)  dexamethasone (DECADRON) 10 MG/ML injection for Pediatric ORAL use 10 mg (10 mg Oral Given 05/05/24 0547)  iohexol (OMNIPAQUE) 300 MG/ML solution 75 mL (75 mLs Intravenous Contrast Given  05/05/24 0607)     IMPRESSION / MDM / ASSESSMENT AND PLAN / ED COURSE  I reviewed the triage vital signs and the nursing notes.                              28 y.o. female with no significant past medical history presents to the ED with a week and a half of hoarse voice and sore throat, additionally reports tender nodules to her neck for the past 2 years and some epigastric/lower chest discomfort over the past 2 days  Patient's presentation is most consistent with acute presentation with potential threat to life or bodily function.  Differential diagnosis includes, but is not limited to, strep pharyngitis, viral pharyngitis, laryngitis, malignancy, epiglottitis, pharyngeal abscess, anemia, electrolyte abnormality, AKI, esophageal candidiasis.  Patient nontoxic-appearing and in no acute distress, vital signs remarkable for tachycardia but otherwise reassuring.  I did review her recent PCP visit, where HIV testing was negative along with strep testing.  Appearance of her posterior oropharynx is concerning with some necrotic appearing tissue as well as a small opening in her palate near the base of the uvula.  We will further assess with CT imaging, lab results are pending at this time.  Plan to treat possible laryngitis with Decadron and reassess.  CT imaging shows significant pharyngitis and sinusitis without evidence of epiglottitis or abscess.  Labs with leukocytosis and mild hypokalemia, no significant anemia or AKI noted.  Patient appropriate for discharge home with ENT follow-up on course of antibiotics to cover for sinusitis and pharyngitis.  She was counseled to quit smoking and to return to the ED for new or worsening symptoms, patient agrees with plan.      FINAL CLINICAL IMPRESSION(S) / ED DIAGNOSES   Final diagnoses:  Pharyngitis, unspecified etiology  Acute sinusitis, recurrence not specified, unspecified location     Rx / DC Orders   ED Discharge Orders          Ordered     amoxicillin-clavulanate (AUGMENTIN) 875-125 MG tablet  2 times daily        05/05/24 0711             Note:  This document was prepared using Dragon voice recognition software and may include unintentional dictation errors.   Twilla Galea, MD 05/05/24 [phone]

## 2024-05-06 NOTE — Telephone Encounter (Signed)
 Please review.  KP

## 2024-05-07 ENCOUNTER — Telehealth: Payer: Self-pay

## 2024-05-07 NOTE — Telephone Encounter (Signed)
 Copied from CRM [id number]. Topic: Clinical - Medical Advice >> May 06, 2024  4:33 PM Stanly Early wrote: Reason for CRM: patient wanted to know if she was okay to return to work. Recent hospital visit and was not sure if she can return to work.Callback [phone]

## 2024-05-10 DIAGNOSIS — Z419 Encounter for procedure for purposes other than remedying health state, unspecified: Secondary | ICD-10-CM | POA: Diagnosis not present

## 2024-06-10 DIAGNOSIS — Z419 Encounter for procedure for purposes other than remedying health state, unspecified: Secondary | ICD-10-CM | POA: Diagnosis not present

## 2024-07-10 DIAGNOSIS — Z419 Encounter for procedure for purposes other than remedying health state, unspecified: Secondary | ICD-10-CM | POA: Diagnosis not present

## 2024-07-20 DIAGNOSIS — F3181 Bipolar II disorder: Secondary | ICD-10-CM | POA: Diagnosis not present

## 2024-08-10 DIAGNOSIS — Z419 Encounter for procedure for purposes other than remedying health state, unspecified: Secondary | ICD-10-CM | POA: Diagnosis not present

## 2024-09-08 DIAGNOSIS — F3181 Bipolar II disorder: Secondary | ICD-10-CM | POA: Diagnosis not present

## 2024-09-10 DIAGNOSIS — Z419 Encounter for procedure for purposes other than remedying health state, unspecified: Secondary | ICD-10-CM | POA: Diagnosis not present

## 2024-11-10 DIAGNOSIS — Z419 Encounter for procedure for purposes other than remedying health state, unspecified: Secondary | ICD-10-CM | POA: Diagnosis not present
